# Patient Record
Sex: Female | Born: 1970 | Race: White | Hispanic: No | Marital: Single | Smoking: Never smoker
Health system: Southern US, Community
[De-identification: ages and names within clinical notes are randomized; demographics above are authoritative.]

## PROBLEM LIST (undated history)

## (undated) DIAGNOSIS — G4733 Obstructive sleep apnea (adult) (pediatric): Secondary | ICD-10-CM

## (undated) DIAGNOSIS — I10 Essential (primary) hypertension: Secondary | ICD-10-CM

## (undated) HISTORY — PX: ABDOMINAL HYSTERECTOMY: SHX81

---

## 2005-03-24 ENCOUNTER — Ambulatory Visit: Payer: Self-pay | Admitting: Internal Medicine

## 2008-09-11 ENCOUNTER — Ambulatory Visit: Payer: Self-pay | Admitting: Family Medicine

## 2010-01-09 ENCOUNTER — Ambulatory Visit: Payer: Self-pay | Admitting: Internal Medicine

## 2010-02-28 ENCOUNTER — Ambulatory Visit: Payer: Self-pay | Admitting: Internal Medicine

## 2010-03-20 ENCOUNTER — Ambulatory Visit: Payer: Self-pay | Admitting: Unknown Physician Specialty

## 2011-03-24 ENCOUNTER — Ambulatory Visit: Payer: Self-pay | Admitting: Unknown Physician Specialty

## 2011-11-03 ENCOUNTER — Ambulatory Visit: Payer: Self-pay | Admitting: Internal Medicine

## 2012-02-27 ENCOUNTER — Ambulatory Visit: Payer: Self-pay

## 2012-02-27 LAB — RAPID INFLUENZA A&B ANTIGENS

## 2012-03-28 ENCOUNTER — Ambulatory Visit: Payer: Self-pay | Admitting: Otolaryngology

## 2012-03-28 LAB — CBC WITH DIFFERENTIAL/PLATELET
Eosinophil #: 0.3 10*3/uL (ref 0.0–0.7)
Eosinophil %: 2.8 %
MCH: 29 pg (ref 26.0–34.0)
MCHC: 33.2 g/dL (ref 32.0–36.0)
MCV: 87 fL (ref 80–100)
Monocyte #: 0.7 x10 3/mm (ref 0.2–0.9)
Monocyte %: 5.7 %
Platelet: 323 10*3/uL (ref 150–440)

## 2012-03-28 LAB — BASIC METABOLIC PANEL
Anion Gap: 9 (ref 7–16)
BUN: 10 mg/dL (ref 7–18)
Creatinine: 0.83 mg/dL (ref 0.60–1.30)
EGFR (African American): >60
EGFR (Non-African Amer.): >60
Glucose: 91 mg/dL (ref 65–99)
Sodium: 138 mmol/L (ref 136–145)

## 2012-03-28 LAB — TSH: Thyroid Stimulating Horm: 3.55 u[IU]/mL

## 2012-03-29 ENCOUNTER — Ambulatory Visit: Payer: Self-pay

## 2013-04-25 ENCOUNTER — Ambulatory Visit: Payer: Self-pay

## 2013-06-13 ENCOUNTER — Ambulatory Visit: Payer: Self-pay | Admitting: Obstetrics and Gynecology

## 2013-07-14 ENCOUNTER — Ambulatory Visit: Payer: Self-pay | Admitting: Obstetrics and Gynecology

## 2014-09-07 ENCOUNTER — Ambulatory Visit: Payer: Self-pay | Admitting: Internal Medicine

## 2015-09-25 ENCOUNTER — Ambulatory Visit
Admission: EM | Admit: 2015-09-25 | Discharge: 2015-09-25 | Disposition: A | Discharge status: Home / Self Care | Payer: Managed Care, Other (non HMO) | Attending: Family Medicine | Admitting: Family Medicine

## 2015-09-25 ENCOUNTER — Ambulatory Visit
Admit: 2015-09-25 | Discharge: 2015-09-25 | Disposition: A | Discharge status: Home / Self Care | Payer: Managed Care, Other (non HMO) | Attending: Family Medicine | Admitting: Family Medicine

## 2015-09-25 DIAGNOSIS — G8929 Other chronic pain: Secondary | ICD-10-CM | POA: Diagnosis not present

## 2015-09-25 DIAGNOSIS — R102 Pelvic and perineal pain: Secondary | ICD-10-CM | POA: Diagnosis not present

## 2015-09-25 DIAGNOSIS — Z79899 Other long term (current) drug therapy: Secondary | ICD-10-CM | POA: Diagnosis not present

## 2015-09-25 DIAGNOSIS — G4733 Obstructive sleep apnea (adult) (pediatric): Secondary | ICD-10-CM | POA: Diagnosis not present

## 2015-09-25 DIAGNOSIS — R1032 Left lower quadrant pain: Secondary | ICD-10-CM | POA: Diagnosis present

## 2015-09-25 DIAGNOSIS — I10 Essential (primary) hypertension: Secondary | ICD-10-CM | POA: Insufficient documentation

## 2015-09-25 HISTORY — DX: Obstructive sleep apnea (adult) (pediatric): G47.33

## 2015-09-25 HISTORY — DX: Essential (primary) hypertension: I10

## 2015-09-25 LAB — URINALYSIS COMPLETE WITH MICROSCOPIC (ARMC ONLY)
Bilirubin Urine: NEGATIVE
Glucose, UA: NEGATIVE mg/dL
Ketones, ur: NEGATIVE mg/dL
Leukocytes, UA: NEGATIVE
NITRITE: NEGATIVE
PH: 5 (ref 5.0–8.0)
PROTEIN: NEGATIVE mg/dL
SPECIFIC GRAVITY, URINE: 1.02 (ref 1.005–1.030)

## 2015-09-25 MED ORDER — CIPROFLOXACIN HCL 500 MG PO TABS: 500.0000 mg | ORAL_TABLET | Freq: Two times a day (BID) | ORAL | Status: DC

## 2015-09-25 MED ORDER — IOHEXOL 300 MG/ML  SOLN
100.0000 mL | Freq: Once | INTRAMUSCULAR | Status: AC | PRN
  Administered 2015-09-25: 100 mL via INTRAVENOUS

## 2015-09-25 MED ORDER — METRONIDAZOLE 500 MG PO TABS: 500.0000 mg | ORAL_TABLET | Freq: Three times a day (TID) | ORAL | Status: DC

## 2015-09-25 MED ORDER — KETOROLAC TROMETHAMINE 10 MG PO TABS: 10.0000 mg | ORAL_TABLET | Freq: Three times a day (TID) | ORAL | Status: DC | PRN

## 2015-09-25 NOTE — ED Provider Notes (Signed)
CSN: [id number]     Arrival date & time 09/25/15  1225 History   First MD Initiated Contact with Patient 09/25/15 1426     Chief Complaint  Patient presents with  . Pelvic Pain   (Consider location/radiation/quality/duration/timing/severity/associated sxs/prior Treatment) HPI Comments: 44 yo female with a h/o chronic intermittent pelvic pains, presents with a recurrence of pelvic pain for the last 3 days and left lower quadrant abdominal pain. States feels like cramps. Denies any dysuria, vaginal discharge, vomiting, abnormal bowel movements, fevers, chills. Patient had a hysterectomy years ago.  The history is provided by the patient.    Past Medical History  Diagnosis Date  . Hypertension   . OSA (obstructive sleep apnea)    Past Surgical History  Procedure Laterality Date  . Abdominal hysterectomy     No family history on file. Social History  Substance Use Topics  . Smoking status: Never Smoker   . Smokeless tobacco: None  . Alcohol Use: Yes   OB History    No data available     Review of Systems  Allergies  Review of patient's allergies indicates no known allergies.  Home Medications   Prior to Admission medications   Medication Sig Start Date End Date Taking? Authorizing Provider  losartan-hydrochlorothiazide (HYZAAR) 100-25 MG tablet Take 1 tablet by mouth daily.   Yes Historical Provider, MD  ciprofloxacin (CIPRO) 500 MG tablet Take 1 tablet (500 mg total) by mouth every 12 (twelve) hours. 09/25/15   Norval Gable, MD  ketorolac (TORADOL) 10 MG tablet Take 1 tablet (10 mg total) by mouth every 8 (eight) hours as needed. 09/25/15   Norval Gable, MD  metroNIDAZOLE (FLAGYL) 500 MG tablet Take 1 tablet (500 mg total) by mouth 3 (three) times daily. 09/25/15   Norval Gable, MD   Meds Ordered and Administered this Visit  Medications - No data to display  BP 150/98 mmHg  Pulse 98  Temp(Src) 97.8 F (36.6 C) (Tympanic)  Resp 22  Ht 5\' 3"  (1.6 m)  Wt 370 lb  (167.831 kg)  BMI 65.56 kg/m2  SpO2 98% No data found.   Physical Exam  Constitutional: She appears well-developed and well-nourished. No distress.  Abdominal: Soft. Bowel sounds are normal. She exhibits no distension and no mass. There is tenderness (left lower quadrant tenderness to palpation). There is no rebound and no guarding.  Genitourinary: Vagina normal. Pelvic exam was performed with patient supine. Cervix exhibits no motion tenderness and no discharge. Right adnexum displays no mass. Left adnexum displays no mass.  Skin: She is not diaphoretic.  Nursing note and vitals reviewed.   ED Course  Procedures (including critical care time)  Labs Review Labs Reviewed  URINALYSIS COMPLETEWITH MICROSCOPIC ([place]) - Abnormal; Notable for the following:    Hgb urine dipstick 2+ (*)    Bacteria, UA MANY (*)    Squamous Epithelial / LPF 6-30 (*)    All other components within normal limits  URINE CULTURE    Imaging Review Ct Abdomen Pelvis W Contrast  09/25/2015  CLINICAL DATA:  Acute worsening of chronic pelvic pain. Acute left lower quadrant abdominal pain. EXAM: CT ABDOMEN AND PELVIS WITH CONTRAST TECHNIQUE: Multidetector CT imaging of the abdomen and pelvis was performed using the standard protocol following bolus administration of intravenous contrast. CONTRAST:  117mL OMNIPAQUE IOHEXOL 300 MG/ML  SOLN COMPARISON:  None. FINDINGS: Lung bases are clear. No effusions. Heart is normal size. Small hiatal hernia. Diffuse fatty infiltration of the liver. No focal  abnormality. Gallbladder, spleen, pancreas, right adrenal and kidneys are unremarkable. 2.5 cm nodule in the left adrenal gland. This most likely reflects an adenoma, measuring 11 Hounsfield units on delayed imaging. There is sigmoid diverticulosis with surrounding inflammatory changes compatible with active diverticulitis. Stomach and small bowel are unremarkable. No free fluid, free air or adenopathy. Prior hysterectomy. No  adnexal masses. Urinary bladder is unremarkable. No acute bony abnormality or focal bone lesion. IMPRESSION: Sigmoid diverticulosis with active diverticulitis. Fatty liver. Left adrenal adenoma. Small hiatal hernia. Electronically Signed   By: Rolm Baptise M.D.   On: 09/25/2015 17:40     Visual Acuity Review  Right Eye Distance:   Left Eye Distance:   Bilateral Distance:    Right Eye Near:   Left Eye Near:    Bilateral Near:         MDM   1. Pelvic pain in female   2. Abdominal pain, left lower quadrant    Discharge Medication List as of 09/25/2015  3:33 PM    START taking these medications   Details  ketorolac (TORADOL) 10 MG tablet Take 1 tablet (10 mg total) by mouth every 8 (eight) hours as needed., Starting 09/25/2015, Until Discontinued, Normal      1. Labs/x-ray results and diagnosis reviewed with patient; patient referred for CT abdomen/pelvis at [facility] and results discussed with patient (positive for diverticulitis) 2. rx as per orders above; reviewed possible side effects, interactions, risks and benefits  3. Recommend supportive treatment with clear liquids, then advance slowly as tolerated 4. Follow up with PCP in next 7-10 days 5. F/u prn if symptoms worsen or don't improve   Norval Gable, MD 09/25/15 2031

## 2015-09-25 NOTE — ED Notes (Signed)
Pt states "before I had my hysterectomy I would pass large blood clots with my period and I would cramp, that what I feel like now, just cramp. I am not having urinary pain, or irritation. I am having constant pelvic pain."

## 2015-09-27 LAB — URINE CULTURE: Special Requests: NORMAL

## 2015-09-30 NOTE — ED Notes (Signed)
Final report of urine C&S shows multiple species = contaminated specimen

## 2015-10-28 DIAGNOSIS — Z9989 Dependence on other enabling machines and devices: Secondary | ICD-10-CM

## 2015-10-28 DIAGNOSIS — G4733 Obstructive sleep apnea (adult) (pediatric): Secondary | ICD-10-CM | POA: Insufficient documentation

## 2016-10-28 DIAGNOSIS — R7989 Other specified abnormal findings of blood chemistry: Secondary | ICD-10-CM | POA: Insufficient documentation

## 2016-10-30 ENCOUNTER — Other Ambulatory Visit: Payer: Self-pay | Admitting: Internal Medicine

## 2016-10-30 DIAGNOSIS — Z1231 Encounter for screening mammogram for malignant neoplasm of breast: Secondary | ICD-10-CM

## 2016-11-04 ENCOUNTER — Ambulatory Visit
Admission: RE | Admit: 2016-11-04 | Discharge: 2016-11-04 | Disposition: A | Discharge status: Home / Self Care | Payer: Managed Care, Other (non HMO) | Source: Ambulatory Visit | Attending: Internal Medicine | Admitting: Internal Medicine

## 2016-11-04 DIAGNOSIS — Z1231 Encounter for screening mammogram for malignant neoplasm of breast: Secondary | ICD-10-CM | POA: Insufficient documentation

## 2017-10-19 ENCOUNTER — Other Ambulatory Visit: Payer: Self-pay | Admitting: Internal Medicine

## 2017-11-15 ENCOUNTER — Other Ambulatory Visit: Payer: Self-pay | Admitting: Internal Medicine

## 2017-11-15 DIAGNOSIS — Z1231 Encounter for screening mammogram for malignant neoplasm of breast: Secondary | ICD-10-CM

## 2017-11-30 ENCOUNTER — Ambulatory Visit
Admission: RE | Admit: 2017-11-30 | Discharge: 2017-11-30 | Disposition: A | Discharge status: Home / Self Care | Payer: Managed Care, Other (non HMO) | Source: Ambulatory Visit | Attending: Internal Medicine | Admitting: Internal Medicine

## 2017-11-30 ENCOUNTER — Encounter (INDEPENDENT_AMBULATORY_CARE_PROVIDER_SITE_OTHER): Payer: Self-pay

## 2017-11-30 DIAGNOSIS — Z1231 Encounter for screening mammogram for malignant neoplasm of breast: Secondary | ICD-10-CM

## 2018-11-01 ENCOUNTER — Other Ambulatory Visit: Payer: Self-pay | Admitting: Internal Medicine

## 2018-11-01 DIAGNOSIS — R319 Hematuria, unspecified: Secondary | ICD-10-CM

## 2018-11-02 ENCOUNTER — Other Ambulatory Visit: Payer: Self-pay | Admitting: Internal Medicine

## 2018-11-02 DIAGNOSIS — Z1231 Encounter for screening mammogram for malignant neoplasm of breast: Secondary | ICD-10-CM

## 2018-11-21 ENCOUNTER — Ambulatory Visit: Payer: Managed Care, Other (non HMO)

## 2018-11-24 ENCOUNTER — Other Ambulatory Visit: Payer: Self-pay

## 2018-11-24 DIAGNOSIS — R3129 Other microscopic hematuria: Secondary | ICD-10-CM

## 2018-11-25 ENCOUNTER — Encounter: Payer: Self-pay | Admitting: Urology

## 2018-11-25 ENCOUNTER — Other Ambulatory Visit
Admission: RE | Admit: 2018-11-25 | Discharge: 2018-11-25 | Disposition: A | Discharge status: Home / Self Care | Payer: Managed Care, Other (non HMO) | Attending: Urology | Admitting: Urology

## 2018-11-25 ENCOUNTER — Ambulatory Visit (INDEPENDENT_AMBULATORY_CARE_PROVIDER_SITE_OTHER): Payer: Managed Care, Other (non HMO) | Admitting: Urology

## 2018-11-25 ENCOUNTER — Other Ambulatory Visit: Payer: Self-pay

## 2018-11-25 VITALS — BP 142/69 | HR 71 | Wt 378.0 lb

## 2018-11-25 DIAGNOSIS — R3129 Other microscopic hematuria: Secondary | ICD-10-CM

## 2018-11-25 LAB — URINALYSIS, COMPLETE (UACMP) WITH MICROSCOPIC
BACTERIA UA: NONE SEEN
Bilirubin Urine: NEGATIVE
GLUCOSE, UA: NEGATIVE mg/dL
KETONES UR: NEGATIVE mg/dL
LEUKOCYTES UA: NEGATIVE
Nitrite: NEGATIVE
Protein, ur: NEGATIVE mg/dL
Specific Gravity, Urine: 1.015 (ref 1.005–1.030)
WBC, UA: NONE SEEN WBC/hpf (ref 0–5)
pH: 5.5 (ref 5.0–8.0)

## 2018-11-25 NOTE — Progress Notes (Signed)
11/25/2018 12:06 PM   Angel Sandoval 03-01-1971 [id number]  Referring provider: Rusty Aus, MD [facility] [place], [place] [address]  Chief Complaint  Patient presents with  . Establish Care  . Hematuria    HPI: 47 year old female referred for further evaluation of microscopic hematuria.  She underwent routine urinalysis by her primary care physician on 10/24/2018 which showed 4-10 red blood cells per high-powered field, otherwise UA was unremarkable.  This was repeated 1 week later which showed 10-50 red blood cells in the absence of infection.  She did have incidental 10-25,000 colonies of group B strep, contaminant.  She has a remote history of kidney stones, passed one stone sponteously years ago.  She never required surgical intervention.  Never smoker and no environmental exposures.    She is never seen gross hematuria.   PMH: Past Medical History:  Diagnosis Date  . Hypertension   . OSA (obstructive sleep apnea)     Surgical History: Past Surgical History:  Procedure Laterality Date  . ABDOMINAL HYSTERECTOMY      Home Medications:  Allergies as of 11/25/2018      Reactions   Corticosteroids Other (See Comments)   Weight gain. Patient would like to avoid.      Medication List       Accurate as of November 25, 2018 12:06 PM. Always use your most recent med list.        hydrochlorothiazide 12.5 MG tablet Commonly known as:  HYDRODIURIL Take 12.5 mg by mouth daily.   olmesartan 20 MG tablet Commonly known as:  BENICAR Take 20 mg by mouth daily.       Allergies:  Allergies  Allergen Reactions  . Corticosteroids Other (See Comments)    Weight gain. Patient would like to avoid.    Family History: Family History  Problem Relation Age of Onset  . Breast cancer Mother 98  . Hypertension Father     Social History:  reports that she has never smoked. She has never used smokeless tobacco.  She reports current alcohol use. She reports that she does not use drugs.  ROS: UROLOGY Frequent Urination?: No Hard to postpone urination?: Yes Burning/pain with urination?: No Get up at night to urinate?: No Leakage of urine?: No Urine stream starts and stops?: No Trouble starting stream?: No Do you have to strain to urinate?: No Blood in urine?: Yes Urinary tract infection?: No Sexually transmitted disease?: No Injury to kidneys or bladder?: No Painful intercourse?: No Weak stream?: No Currently pregnant?: No Vaginal bleeding?: No Last menstrual period?: n  Gastrointestinal Nausea?: No Vomiting?: No Indigestion/heartburn?: No Diarrhea?: Yes Constipation?: Yes  Constitutional Fever: No Night sweats?: No Weight loss?: No Fatigue?: No  Skin Skin rash/lesions?: No Itching?: No  Eyes Blurred vision?: No Double vision?: No  Ears/Nose/Throat Sore throat?: No Sinus problems?: No  Hematologic/Lymphatic Swollen glands?: No Easy bruising?: No  Cardiovascular Leg swelling?: No Chest pain?: No  Respiratory Cough?: No Shortness of breath?: No  Endocrine Excessive thirst?: No  Musculoskeletal Back pain?: No Joint pain?: No  Neurological Headaches?: No Dizziness?: No  Psychologic Depression?: No Anxiety?: No  Physical Exam: BP (!) 142/69   Pulse 71   Wt (!) 378 lb (171.5 kg)   BMI 66.96 kg/m   Constitutional:  Alert and oriented, No acute distress. HEENT: [place] AT, moist mucus membranes.  Trachea midline, no masses. Cardiovascular: No clubbing, cyanosis, or edema. Respiratory: Normal respiratory effort, no increased work of  breathing. GI: Morbidly obese Skin: No rashes, bruises or suspicious lesions. Neurologic: Grossly intact, no focal deficits, moving all 4 extremities. Psychiatric: Normal mood and affect.  Laboratory Data: Cr 0.7 10/24/2018  Urinalysis Results for orders placed or performed during the hospital encounter of 11/25/18    Urinalysis, Complete w Microscopic  Result Value Ref Range   Color, Urine YELLOW YELLOW   APPearance CLEAR CLEAR   Specific Gravity, Urine 1.015 1.005 - 1.030   pH 5.5 5.0 - 8.0   Glucose, UA NEGATIVE NEGATIVE mg/dL   Hgb urine dipstick MODERATE (A) NEGATIVE   Bilirubin Urine NEGATIVE NEGATIVE   Ketones, ur NEGATIVE NEGATIVE mg/dL   Protein, ur NEGATIVE NEGATIVE mg/dL   Nitrite NEGATIVE NEGATIVE   Leukocytes, UA NEGATIVE NEGATIVE   Squamous Epithelial / LPF 0-5 0 - 5   WBC, UA NONE SEEN 0 - 5 WBC/hpf   RBC / HPF 0-5 0 - 5 RBC/hpf   Bacteria, UA NONE SEEN NONE SEEN    Pertinent Imaging: NA  Assessment & Plan:    1. Microscopic hematuria We discussed the differential diagnosis for microscopic hematuria including nephrolithiasis, renal or upper tract tumors, bladder stones, UTIs, or bladder tumors as well as undetermined etiologies. Per AUA guidelines, I did recommend complete microscopic hematuria evaluation including CTU, possible urine cytology, and office cystoscopy.  - CT HEMATURIA WORKUP; Future    Return in about 4 weeks (around 12/23/2018) for cysto/ CT urogram.  Hollice Espy, MD  [facility] [address] [place], [place] [address] [phone]

## 2018-11-25 NOTE — Patient Instructions (Signed)
Cystoscopy  Cystoscopy is a procedure that is used to help diagnose and sometimes treat conditions that affect that lower urinary tract. The lower urinary tract includes the bladder and the tube that drains urine from the bladder out of the body (urethra). Cystoscopy is performed with a thin, tube-shaped instrument with a light and camera at the end (cystoscope). The cystoscope may be hard (rigid) or flexible, depending on the goal of the procedure.The cystoscope is inserted through the urethra, into the bladder.  Cystoscopy may be recommended if you have:   Urinary tractinfections that keep coming back (recurring).   Blood in the urine (hematuria).   Loss of bladder control (urinary incontinence) or an overactive bladder.   Unusual cells found in a urine sample.   A blockage in the urethra.   Painful urination.   An abnormality in the bladder found during an intravenous pyelogram (IVP) or CT scan.    Cystoscopy may also be done to remove a sample of tissue to be examined under a microscope (biopsy).  Tell a health care provider about:   Any allergies you have.   All medicines you are taking, including vitamins, herbs, eye drops, creams, and over-the-counter medicines.   Any problems you or family members have had with anesthetic medicines.   Any blood disorders you have.   Any surgeries you have had.   Any medical conditions you have.   Whether you are pregnant or may be pregnant.  What are the risks?  Generally, this is a safe procedure. However, problems may occur, including:   Infection.   Bleeding.   Allergic reactions to medicines.   Damage to other structures or organs.    What happens before the procedure?   Ask your health care provider about:  ? Changing or stopping your regular medicines. This is especially important if you are taking diabetes medicines or blood thinners.  ? Taking medicines such as aspirin and ibuprofen. These medicines can thin your blood. Do not take these medicines  before your procedure if your health care provider instructs you not to.   Follow instructions from your health care provider about eating or drinking restrictions.   You may be given antibiotic medicine to help prevent infection.   You may have an exam or testing, such as X-rays of the bladder, urethra, or kidneys.   You may have urine tests to check for signs of infection.   Plan to have someone take you home after the procedure.  What happens during the procedure?   To reduce your risk of infection,your health care team will wash or sanitize their hands.   You will be given one or more of the following:  ? A medicine to help you relax (sedative).  ? A medicine to numb the area (local anesthetic).   The area around the opening of your urethra will be cleaned.   The cystoscope will be passed through your urethra into your bladder.   Germ-free (sterile)fluid will flow through the cystoscope to fill your bladder. The fluid will stretch your bladder so that your surgeon can clearly examine your bladder walls.   The cystoscope will be removed and your bladder will be emptied.  The procedure may vary among health care providers and hospitals.  What happens after the procedure?   You may have some soreness or pain in your abdomen and urethra. Medicines will be available to help you.   You may have some blood in your urine.   Do not   drive for 24 hours if you received a sedative.  This information is not intended to replace advice given to you by your health care provider. Make sure you discuss any questions you have with your health care provider.  Document Released: 11/27/2000 Document Revised: 04/09/2016 Document Reviewed: 10/17/2015  Elsevier Interactive Patient Education  2018 [employer]

## 2018-12-01 ENCOUNTER — Ambulatory Visit
Admission: RE | Admit: 2018-12-01 | Discharge: 2018-12-01 | Disposition: A | Discharge status: Home / Self Care | Payer: Managed Care, Other (non HMO) | Source: Ambulatory Visit | Attending: Internal Medicine | Admitting: Internal Medicine

## 2018-12-01 DIAGNOSIS — Z1231 Encounter for screening mammogram for malignant neoplasm of breast: Secondary | ICD-10-CM | POA: Diagnosis not present

## 2019-01-10 ENCOUNTER — Ambulatory Visit
Admission: RE | Admit: 2019-01-10 | Discharge: 2019-01-10 | Disposition: A | Discharge status: Home / Self Care | Payer: Managed Care, Other (non HMO) | Source: Ambulatory Visit | Attending: Urology | Admitting: Urology

## 2019-01-10 ENCOUNTER — Other Ambulatory Visit: Payer: Self-pay

## 2019-01-10 ENCOUNTER — Other Ambulatory Visit
Admission: RE | Admit: 2019-01-10 | Discharge: 2019-01-10 | Disposition: A | Discharge status: Home / Self Care | Payer: Managed Care, Other (non HMO) | Source: Home / Self Care | Attending: Urology | Admitting: Urology

## 2019-01-10 DIAGNOSIS — R3129 Other microscopic hematuria: Secondary | ICD-10-CM | POA: Insufficient documentation

## 2019-01-10 LAB — CREATININE, SERUM: Creatinine, Ser: 0.74 mg/dL (ref 0.44–1.00)

## 2019-01-10 MED ORDER — IOHEXOL 300 MG/ML  SOLN
150.0000 mL | Freq: Once | INTRAMUSCULAR | Status: AC | PRN
  Administered 2019-01-10: 125 mL via INTRAVENOUS

## 2019-01-12 ENCOUNTER — Other Ambulatory Visit: Payer: Self-pay

## 2019-01-12 DIAGNOSIS — R3129 Other microscopic hematuria: Secondary | ICD-10-CM

## 2019-01-13 ENCOUNTER — Encounter: Payer: Self-pay | Admitting: Urology

## 2019-01-13 ENCOUNTER — Ambulatory Visit (INDEPENDENT_AMBULATORY_CARE_PROVIDER_SITE_OTHER): Payer: Managed Care, Other (non HMO) | Admitting: Urology

## 2019-01-13 ENCOUNTER — Other Ambulatory Visit
Admission: RE | Admit: 2019-01-13 | Discharge: 2019-01-13 | Disposition: A | Discharge status: Home / Self Care | Payer: Managed Care, Other (non HMO) | Attending: Urology | Admitting: Urology

## 2019-01-13 VITALS — BP 138/70 | HR 84 | Ht 64.0 in | Wt 370.0 lb

## 2019-01-13 DIAGNOSIS — R3129 Other microscopic hematuria: Secondary | ICD-10-CM

## 2019-01-13 DIAGNOSIS — D3502 Benign neoplasm of left adrenal gland: Secondary | ICD-10-CM

## 2019-01-13 DIAGNOSIS — K439 Ventral hernia without obstruction or gangrene: Secondary | ICD-10-CM

## 2019-01-13 LAB — URINALYSIS, COMPLETE (UACMP) WITH MICROSCOPIC
Bilirubin Urine: NEGATIVE
Glucose, UA: NEGATIVE mg/dL
Ketones, ur: NEGATIVE mg/dL
Leukocytes, UA: NEGATIVE
Nitrite: NEGATIVE
PH: 6.5 (ref 5.0–8.0)
Protein, ur: NEGATIVE mg/dL
SPECIFIC GRAVITY, URINE: 1.02 (ref 1.005–1.030)
WBC, UA: NONE SEEN WBC/hpf (ref 0–5)

## 2019-01-13 NOTE — Progress Notes (Signed)
   01/13/19  CC:  Chief Complaint  Patient presents with  . Cysto    HPI: 48 year old female with microscopic hematuria returns the office today for cystoscopy.  In the interim, she underwent CT urogram on 01/10/2019 which showed some non-urologic incidental findings including suprapubic umbilical hernia, incidental left adrenal adenoma (3.6 by 3.4 cm left adrenal adenoma), colonic diverticula, type I hiatal hernia, degenerative disc disease and osteitis pubis.  Blood pressure 138/70, pulse 84, height 5\' 4"  (1.626 m), weight (!) 370 lb (167.8 kg). NED. A&Ox3.   No respiratory distress   Abd soft, NT, ND Normal external genitalia with patent urethral meatus  Cystoscopy Procedure Note  Patient identification was confirmed, informed consent was obtained, and patient was prepped using Betadine solution.  Lidocaine jelly was administered per urethral meatus.    Procedure: - Flexible cystoscope introduced, without any difficulty.   - Thorough search of the bladder revealed:    normal urethral meatus    normal urothelium    no stones    no ulcers     no tumors    no urethral polyps    no trabeculation  - Ureteral orifices were normal in position and appearance.  Small amount of squamous metaplasia appreciable extending to the bladder neck towards the trigone without any other significant findings.  Post-Procedure: - Patient tolerated the procedure well  Assessment/ Plan:  1. Microscopic hematuria Status post negative work-up including CT urogram and cystoscopy today Patient was reassured  2. Adrenal adenoma, left Incidental left adrenal adenoma, essentially stable from CT CAT scan in 2016. We discussed the differential chain functional and nonfunctional adenoma She has no signs or symptoms of a functional adenoma including no history of palpitation, flushing, anxiety, low potassium etc.  She does have hypertension which is managed on 1 antihypertensive medication in the  setting of morbid obesity. She was offered referral to endocrinology for further metabolic evaluation but declined  3. Ventral hernia without obstruction or gangrene Incidental ventral hernia, relatively asymptomatic.  We discussed the option of referral to general surgery if she becomes symptomatic although would strongly recommend weight loss prior to any surgical regimen.  She is agreeable this plan.  F/u prn  Hollice Espy, MD

## 2019-11-17 IMAGING — CT CT ABD-PEL WO/W CM
3 of 6 series · 14 of 32 positions shown, 19 images · IV contrast (omnipaque)
Comparison: 09/25/2015

CLINICAL DATA: Gross hematuria since October 2018. History of
renal calculi. Altered bowel habits..

EXAM:
CT ABDOMEN AND PELVIS WITHOUT AND WITH CONTRAST
TECHNIQUE: Multidetector CT imaging of the abdomen and pelvis was performed
following the standard protocol before and following the bolus
administration of intravenous contrast.
CONTRAST:  125mL OMNIPAQUE IOHEXOL 300 MG/ML  SOLN

[Series 2: axial pre · axial · non-contrast · 0.85mm/px · z∈[-852,-512]mm · 6 of 96 slices shown, 11 images]
[im 14/96  soft-tissue]
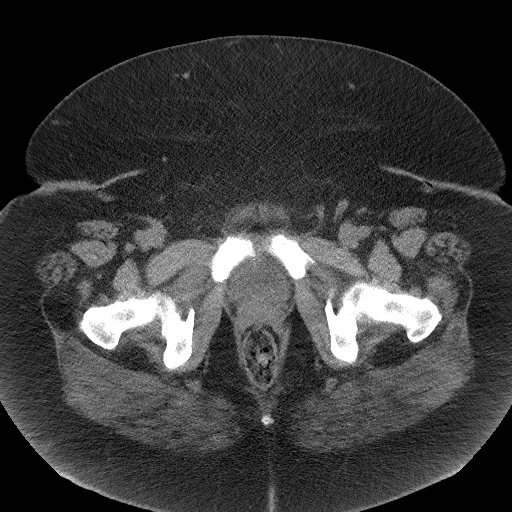
[im 14/96  bone]
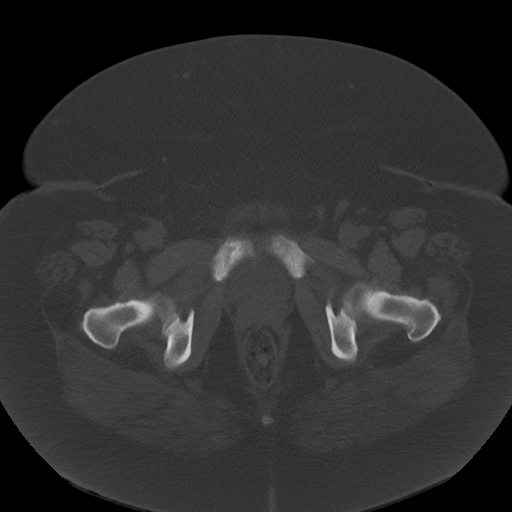
[im 28/96  soft-tissue]
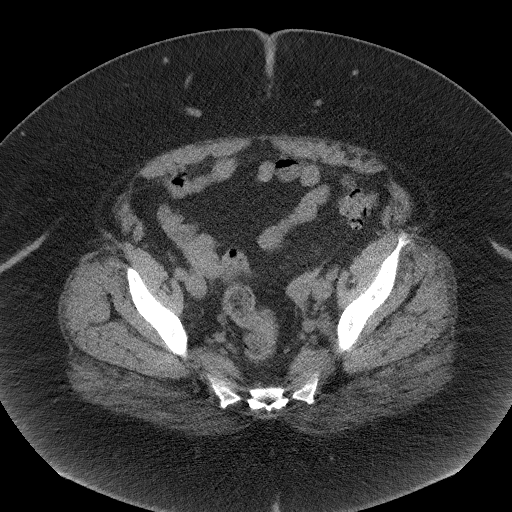
[im 41/96  soft-tissue]
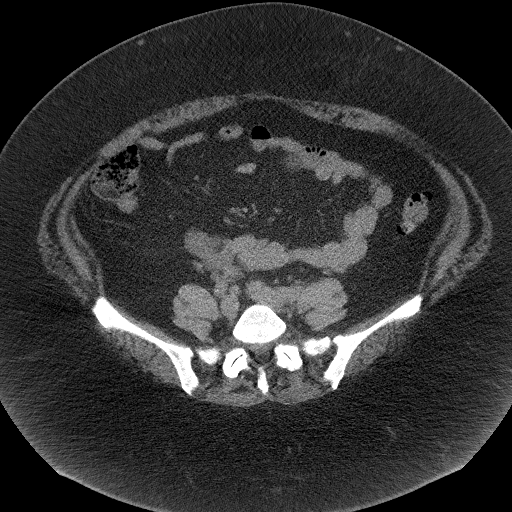
[im 41/96  lung]
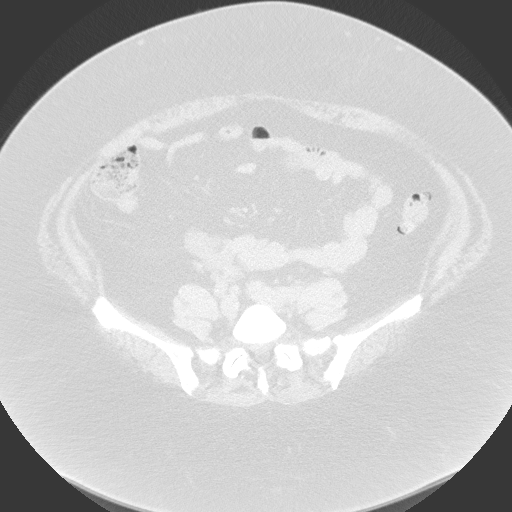
[im 55/96  soft-tissue]
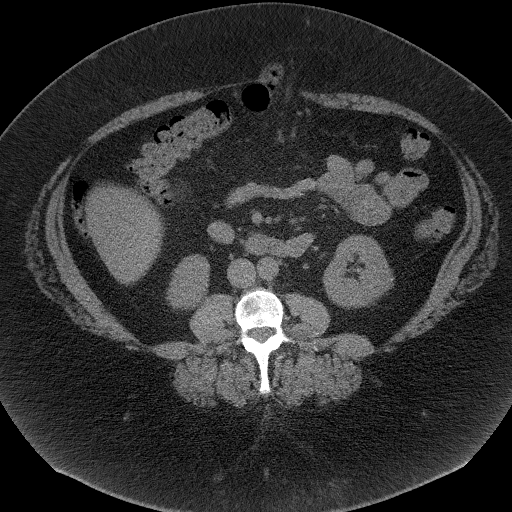
[im 55/96  lung]
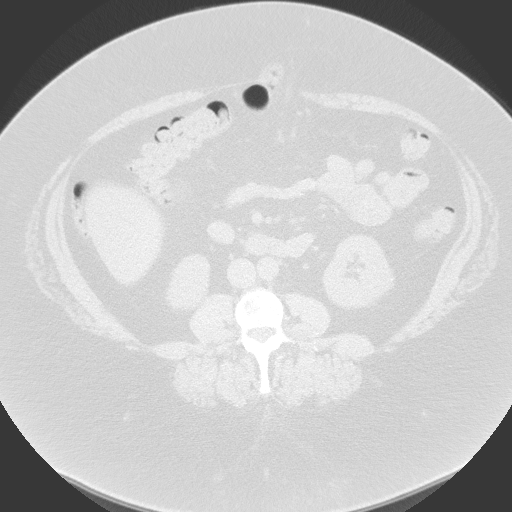
[im 68/96  soft-tissue]
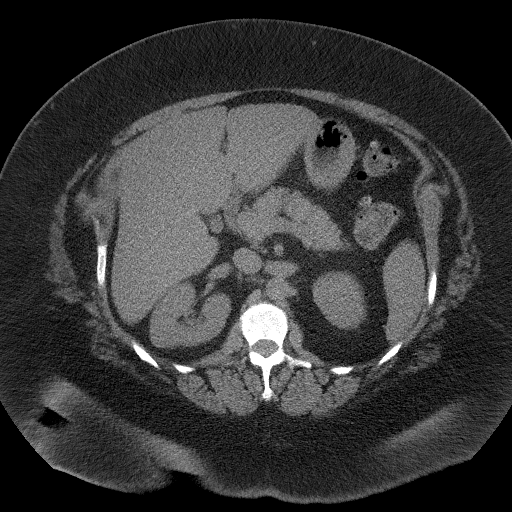
[im 68/96  lung]
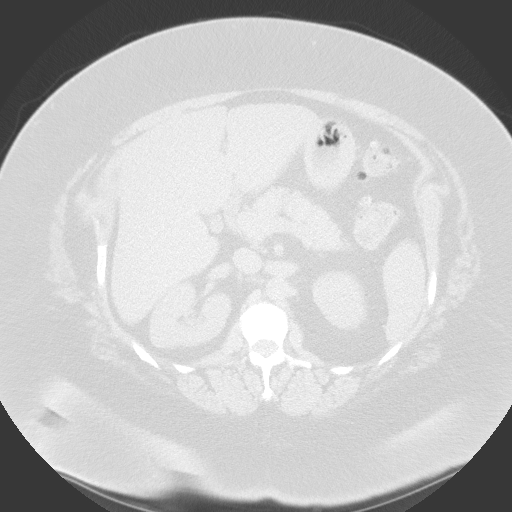
[im 82/96  soft-tissue]
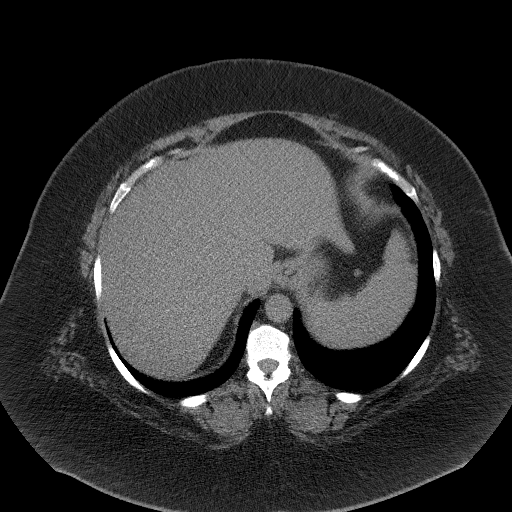
[im 82/96  lung]
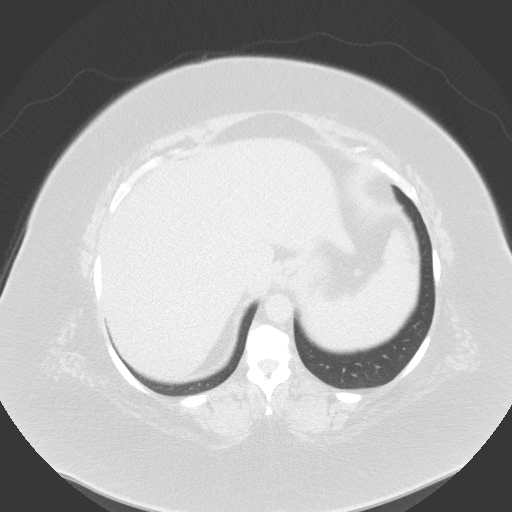

[Series 4: axial post · axial · 0.86mm/px · z∈[-851,-511]mm · 6 of 96 slices shown]
[im 14/96  soft-tissue]
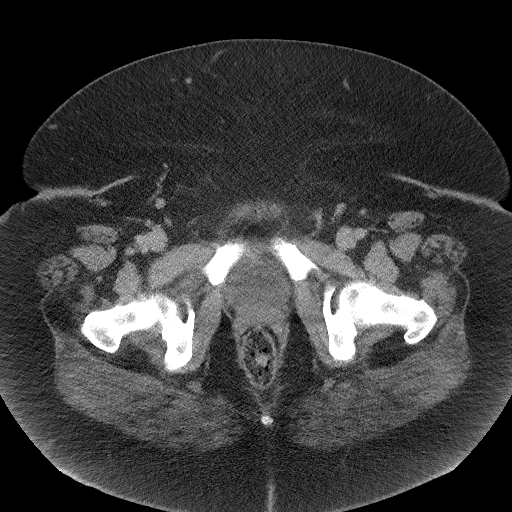
[im 28/96  soft-tissue]
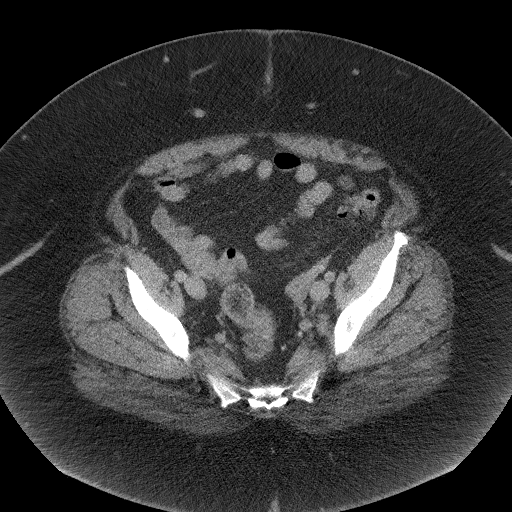
[im 41/96  soft-tissue]
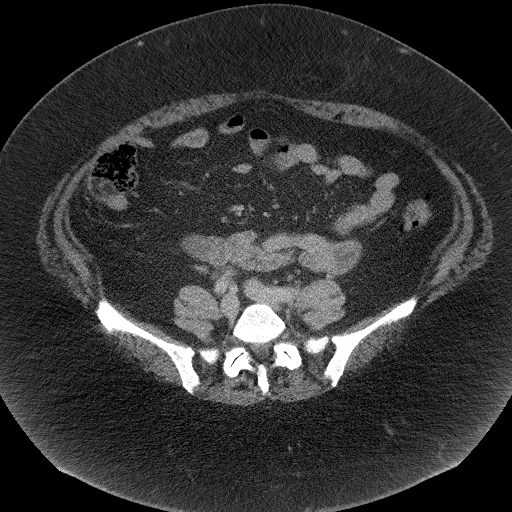
[im 55/96  soft-tissue]
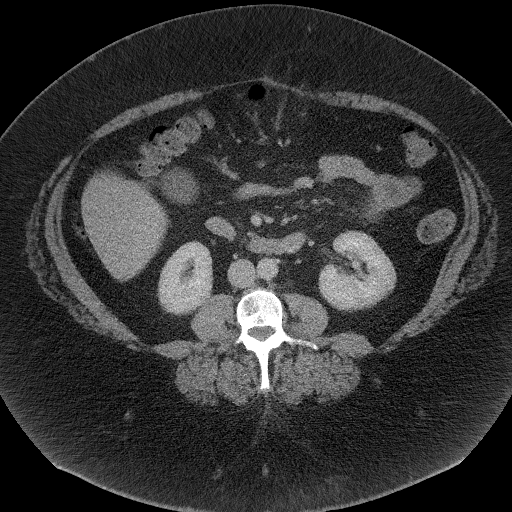
[im 68/96  soft-tissue]
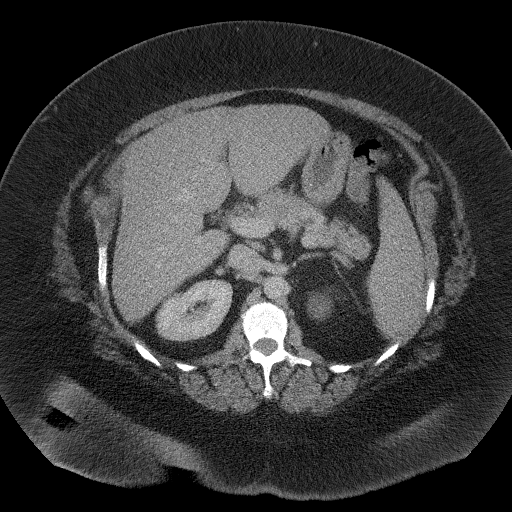
[im 82/96  soft-tissue]
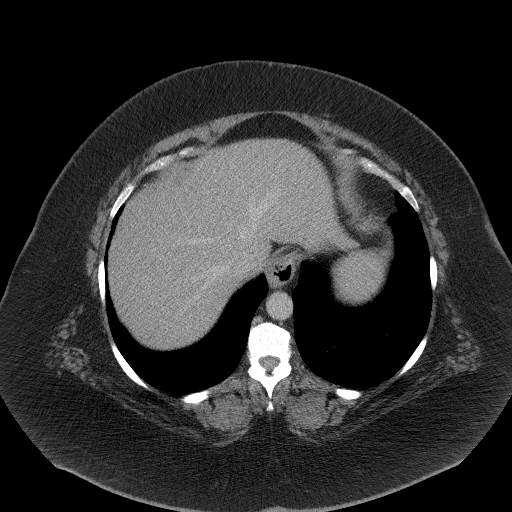

[Series 7: axial delay · axial · delayed · 0.86mm/px · z∈[-841,-776]mm · 2 of 94 slices shown]
[im 14/94  soft-tissue]
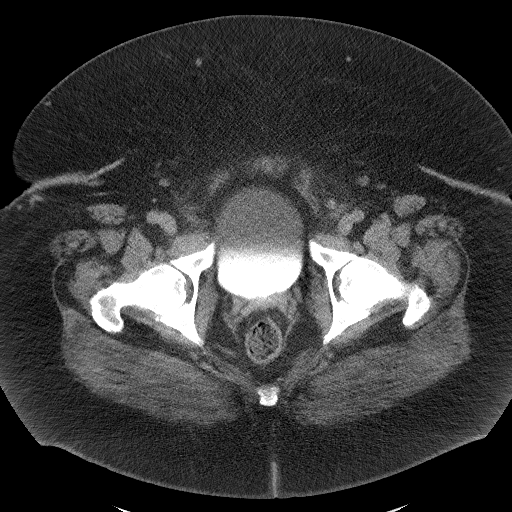
[im 27/94  soft-tissue]
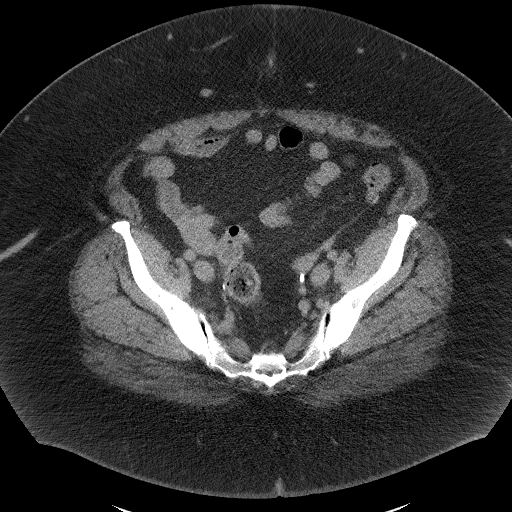

[14 of 32 positions shown; findings below may reference images not displayed]

FINDINGS: Lower chest: Small type 1 hiatal hernia.

Hepatobiliary: 4 mm hypodense lesion in the right hepatic lobe on
image [DATE], stable and felt to be benign. Gallbladder unremarkable.
No biliary dilatation.

Pancreas: Unremarkable

Spleen: Unremarkable

Adrenals/Urinary Tract: 3.6 by 3.4 cm left adrenal adenoma.

No urinary tract calculi, abnormal renal parenchymal enhancement, or
abnormal urographic phase filling defect along the urothelium to
explain the patient's hematuria.

Stomach/Bowel: Left paracentral ventral hernia contains adipose
tissue and a small portion of the transverse colon. No findings of
strangulation or obstruction. Sigmoid diverticulosis with scattered
diverticula elsewhere in the colon.

Vascular/Lymphatic: Unremarkable

Reproductive: Uterus absent.  Adnexa unremarkable.

Other: No supplemental non-categorized findings.

Musculoskeletal: Osteitis pubis. Bilateral chronic sacroiliitis with
subcortical sclerosis. Grade 1 degenerative anterolisthesis at L4-5.
IMPRESSION: 1. No cause for hematuria is identified.
2. Supraumbilical ventral hernia containing part of the transverse
colon and adipose tissue without complicating feature.
3. Left adrenal adenoma.
4. Scattered colonic diverticula most concentrated in the sigmoid
colon.
5. Small type 1 hiatal hernia.
6. Grade 1 degenerative anterolisthesis at L4-5.
7. Osteitis pubis with bilateral chronic sacroiliitis.

## 2022-04-02 ENCOUNTER — Encounter: Payer: Self-pay | Admitting: *Deleted

## 2022-04-03 ENCOUNTER — Encounter
Admission: RE | Disposition: A | Discharge status: Home / Self Care | Payer: Self-pay | Source: Home / Self Care | Attending: Gastroenterology

## 2022-04-03 ENCOUNTER — Ambulatory Visit: Payer: BC Managed Care – PPO | Admitting: Certified Registered"

## 2022-04-03 ENCOUNTER — Ambulatory Visit
Admission: RE | Admit: 2022-04-03 | Discharge: 2022-04-03 | Disposition: A | Discharge status: Home / Self Care | Payer: BC Managed Care – PPO | Attending: Gastroenterology | Admitting: Gastroenterology

## 2022-04-03 ENCOUNTER — Encounter: Payer: Self-pay | Admitting: *Deleted

## 2022-04-03 DIAGNOSIS — G4733 Obstructive sleep apnea (adult) (pediatric): Secondary | ICD-10-CM | POA: Diagnosis not present

## 2022-04-03 DIAGNOSIS — K640 First degree hemorrhoids: Secondary | ICD-10-CM | POA: Insufficient documentation

## 2022-04-03 DIAGNOSIS — Z90710 Acquired absence of both cervix and uterus: Secondary | ICD-10-CM | POA: Diagnosis not present

## 2022-04-03 DIAGNOSIS — K5140 Inflammatory polyps of colon without complications: Secondary | ICD-10-CM | POA: Diagnosis not present

## 2022-04-03 DIAGNOSIS — I10 Essential (primary) hypertension: Secondary | ICD-10-CM | POA: Insufficient documentation

## 2022-04-03 DIAGNOSIS — Z1211 Encounter for screening for malignant neoplasm of colon: Secondary | ICD-10-CM | POA: Diagnosis present

## 2022-04-03 DIAGNOSIS — K5730 Diverticulosis of large intestine without perforation or abscess without bleeding: Secondary | ICD-10-CM | POA: Diagnosis not present

## 2022-04-03 DIAGNOSIS — E119 Type 2 diabetes mellitus without complications: Secondary | ICD-10-CM | POA: Diagnosis not present

## 2022-04-03 HISTORY — PX: COLONOSCOPY WITH PROPOFOL: SHX5780

## 2022-04-03 SURGERY — COLONOSCOPY WITH PROPOFOL
Anesthesia: General

## 2022-04-03 MED ORDER — DEXMEDETOMIDINE HCL IN NACL 200 MCG/50ML IV SOLN
INTRAVENOUS | Status: DC | PRN
  Administered 2022-04-03: 12 ug via INTRAVENOUS

## 2022-04-03 MED ORDER — KETAMINE HCL 10 MG/ML IJ SOLN
INTRAMUSCULAR | Status: DC | PRN
  Administered 2022-04-03: 25 mg via INTRAVENOUS

## 2022-04-03 MED ORDER — SODIUM CHLORIDE 0.9 % IV SOLN
INTRAVENOUS | Status: DC
  Administered 2022-04-03: 20 mL/h via INTRAVENOUS

## 2022-04-03 MED ORDER — KETAMINE HCL 50 MG/5ML IJ SOSY
PREFILLED_SYRINGE | INTRAMUSCULAR | Status: AC
  Filled 2022-04-03: qty 5

## 2022-04-03 MED ORDER — PROPOFOL 10 MG/ML IV BOLUS
INTRAVENOUS | Status: DC | PRN
  Administered 2022-04-03: 60 mg via INTRAVENOUS
  Administered 2022-04-03: 50 mg via INTRAVENOUS

## 2022-04-03 MED ORDER — PROPOFOL 500 MG/50ML IV EMUL
INTRAVENOUS | Status: AC
  Filled 2022-04-03: qty 50

## 2022-04-03 MED ORDER — LIDOCAINE HCL (CARDIAC) PF 100 MG/5ML IV SOSY
PREFILLED_SYRINGE | INTRAVENOUS | Status: DC | PRN
Start: 2022-04-03 — End: 2022-04-03
  Administered 2022-04-03: 100 mg via INTRAVENOUS

## 2022-04-03 MED ORDER — PROPOFOL 500 MG/50ML IV EMUL
INTRAVENOUS | Status: DC | PRN
Start: 2022-04-03 — End: 2022-04-03
  Administered 2022-04-03: 100 ug/kg/min via INTRAVENOUS

## 2022-04-03 NOTE — Transfer of Care (Signed)
Immediate Anesthesia Transfer of Care Note ? ?Patient: Angel Sandoval ? ?Procedure(s) Performed: COLONOSCOPY WITH PROPOFOL ? ?Patient Location: PACU ? ?Anesthesia Type:General ? ?Level of Consciousness: awake ? ?Airway & Oxygen Therapy: Patient Spontanous Breathing ? ?Post-op Assessment: Report given to RN ? ?Post vital signs: stable ? ?Last Vitals:  ?Vitals Value Taken Time  ?BP    ?Temp    ?Pulse    ?Resp    ?SpO2    ? ? ?Last Pain:  ?Vitals:  ? 04/03/22 0654  ?TempSrc: Temporal  ?PainSc: 0-No pain  ?   ? ?  ? ?Complications: No notable events documented. ?

## 2022-04-03 NOTE — Interval H&P Note (Signed)
History and Physical Interval Note: ? ?04/03/2022 ?7:45 AM ? ?Angel Sandoval  has presented today for surgery, with the diagnosis of Screening Colonoscopy.  The various methods of treatment have been discussed with the patient and family. After consideration of risks, benefits and other options for treatment, the patient has consented to  Procedure(s): ?COLONOSCOPY WITH PROPOFOL (N/A) as a surgical intervention.  The patient's history has been reviewed, patient examined, no change in status, stable for surgery.  I have reviewed the patient's chart and labs.  Questions were answered to the patient's satisfaction.   ? ? ?Angel Sandoval ? ?Ok to proceed with colonoscopy ?

## 2022-04-03 NOTE — Anesthesia Postprocedure Evaluation (Signed)
Anesthesia Post Note ? ?Patient: Angel Sandoval ? ?Procedure(s) Performed: COLONOSCOPY WITH PROPOFOL ? ?Patient location during evaluation: Endoscopy ?Anesthesia Type: General ?Level of consciousness: awake and alert ?Pain management: pain level controlled ?Vital Signs Assessment: post-procedure vital signs reviewed and stable ?Respiratory status: spontaneous breathing, nonlabored ventilation, respiratory function stable and patient connected to nasal cannula oxygen ?Cardiovascular status: blood pressure returned to baseline and stable ?Postop Assessment: no apparent nausea or vomiting ?Anesthetic complications: no ? ? ?No notable events documented. ? ? ?Last Vitals:  ?Vitals:  ? 04/03/22 0654 04/03/22 0812  ?BP: (!) 144/85 133/72  ?Pulse: 90 81  ?Resp: 20 16  ?Temp: (!) 35.6 ?C (!) 36.2 ?C  ?SpO2: 98% 97%  ?  ?Last Pain:  ?Vitals:  ? 04/03/22 0832  ?TempSrc:   ?PainSc: 0-No pain  ? ? ?  ?  ?  ?  ?  ?  ? ?Martha Clan ? ? ? ? ?

## 2022-04-03 NOTE — H&P (Signed)
Outpatient short stay form Pre-procedure ?04/03/2022  ?Lesly Rubenstein, MD ? ?Primary Physician: Rusty Aus, MD ? ?Reason for visit:  Screening ? ?History of present illness:   ? ?51 y/o lady with morbid obesity and hypertension here for screening colonoscopy. No blood thinners, no family history of GI malignancies. History of hysterectomy. ? ? ? ?Current Facility-Administered Medications:  ?  0.9 %  sodium chloride infusion, , Intravenous, Continuous, Alisha Bacus, Hilton Cork, MD, Last Rate: 20 mL/hr at 04/03/22 0706, 20 mL/hr at 04/03/22 0706 ? ?Medications Prior to Admission  ?Medication Sig Dispense Refill Last Dose  ? hydrochlorothiazide (HYDRODIURIL) 12.5 MG tablet Take 12.5 mg by mouth daily.   04/03/2022 at 0555  ? olmesartan (BENICAR) 20 MG tablet Take 20 mg by mouth daily.     ? ? ? ?Allergies  ?Allergen Reactions  ? Corticosteroids Other (See Comments)  ?  Weight gain. Patient would like to avoid.  ? ? ? ?Past Medical History:  ?Diagnosis Date  ? Hypertension   ? OSA (obstructive sleep apnea)   ? ? ?Review of systems:  Otherwise negative.  ? ? ?Physical Exam ? ?Gen: Alert, oriented. Appears stated age.  ?HEENT: [place]ERR[place]. ?Lungs: No respiratory distress ?CV: RRR ?Abd: soft, benign, no masses ?Ext: No edema ? ? ? ?Planned procedures: Proceed with colonoscopy. The patient understands the nature of the planned procedure, indications, risks, alternatives and potential complications including but not limited to bleeding, infection, perforation, damage to internal organs and possible oversedation/side effects from anesthesia. The patient agrees and gives consent to proceed.  ?Please refer to procedure notes for findings, recommendations and patient disposition/instructions.  ? ? ? ?Lesly Rubenstein, MD ?[facility] ? ? ? ?  ? ?

## 2022-04-03 NOTE — Anesthesia Preprocedure Evaluation (Signed)
Anesthesia Evaluation  ?Patient identified by MRN, date of birth, ID band ?Patient awake ? ? ? ?Reviewed: ?Allergy & Precautions, H&P , NPO status , Patient's Chart, lab work & pertinent test results, reviewed documented beta blocker date and time  ? ?History of Anesthesia Complications ?Negative for: history of anesthetic complications ? ?Airway ?Mallampati: I ? ?TM Distance: >3 FB ?Neck ROM: full ? ? ? Dental ? ?(+) Dental Advidsory Given, Teeth Intact ?  ?Pulmonary ?shortness of breath and with exertion, sleep apnea and Continuous Positive Airway Pressure Ventilation , neg COPD, neg recent URI,  ?  ?Pulmonary exam normal ?breath sounds clear to auscultation ? ? ? ? ? ? Cardiovascular ?Exercise Tolerance: Good ?hypertension, (-) angina(-) Past MI and (-) Cardiac Stents Normal cardiovascular exam(-) dysrhythmias (-) Valvular Problems/Murmurs ?Rhythm:regular Rate:Normal ? ? ?  ?Neuro/Psych ?negative neurological ROS ? negative psych ROS  ? GI/Hepatic ?negative GI ROS, Neg liver ROS,   ?Endo/Other  ?diabetes (borderline)Morbid obesity ? Renal/GU ?negative Renal ROS  ?negative genitourinary ?  ?Musculoskeletal ? ? Abdominal ?  ?Peds ? Hematology ?negative hematology ROS ?(+)   ?Anesthesia Other Findings ?Past Medical History: ?No date: Hypertension ?No date: OSA (obstructive sleep apnea) ? ? Reproductive/Obstetrics ?negative OB ROS ? ?  ? ? ? ? ? ? ? ? ? ? ? ? ? ?  ?  ? ? ? ? ? ? ? ? ?Anesthesia Physical ?Anesthesia Plan ? ?ASA: 3 ? ?Anesthesia Plan: General  ? ?Post-op Pain Management:   ? ?Induction: Intravenous ? ?PONV Risk Score and Plan: 3 and Propofol infusion and TIVA ? ?Airway Management Planned: Natural Airway and Nasal Cannula ? ?Additional Equipment:  ? ?Intra-op Plan:  ? ?Post-operative Plan:  ? ?Informed Consent: I have reviewed the patients History and Physical, chart, labs and discussed the procedure including the risks, benefits and alternatives for the proposed  anesthesia with the patient or authorized representative who has indicated his/her understanding and acceptance.  ? ? ? ?Dental Advisory Given ? ?Plan Discussed with: Anesthesiologist, CRNA and Surgeon ? ?Anesthesia Plan Comments:   ? ? ? ? ? ? ?Anesthesia Quick Evaluation ? ?

## 2022-04-03 NOTE — Op Note (Signed)
[facility] ?Gastroenterology ?Patient Name: Angel Sandoval ?Procedure Date: 04/03/2022 7:15 AM ?MRN: [id number] ?Account #: [id number] ?Date of Birth: August 14, 1971 ?Admit Type: Outpatient ?Age: 51 ?Room: [facility] ENDO ROOM 3 ?Gender: Female ?Note Status: Finalized ?Instrument Name: Colonoscope [id number] ?Procedure:             Colonoscopy ?Indications:           Screening for colorectal malignant neoplasm ?Providers:             Andrey Farmer MD, MD ?Referring MD:          Rusty Aus, MD (Referring MD) ?Medicines:             Monitored Anesthesia Care ?Complications:         No immediate complications. Estimated blood loss:  ?                       Minimal. ?Procedure:             Pre-Anesthesia Assessment: ?                       - Prior to the procedure, a History and Physical was  ?                       performed, and patient medications and allergies were  ?                       reviewed. The patient is competent. The risks and  ?                       benefits of the procedure and the sedation options and  ?                       risks were discussed with the patient. All questions  ?                       were answered and informed consent was obtained.  ?                       Patient identification and proposed procedure were  ?                       verified by the physician, the nurse, the  ?                       anesthesiologist, the anesthetist and the technician  ?                       in the endoscopy suite. Mental Status Examination:  ?                       alert and oriented. Airway Examination: normal  ?                       oropharyngeal airway and neck mobility. Respiratory  ?                       Examination: clear to auscultation. CV Examination:  ?  normal. Prophylactic Antibiotics: The patient does not  ?                       require prophylactic antibiotics. Prior  ?                       Anticoagulants: The patient has taken no previous  ?                        anticoagulant or antiplatelet agents. ASA Grade  ?                       Assessment: III - A patient with severe systemic  ?                       disease. After reviewing the risks and benefits, the  ?                       patient was deemed in satisfactory condition to  ?                       undergo the procedure. The anesthesia plan was to use  ?                       monitored anesthesia care (MAC). Immediately prior to  ?                       administration of medications, the patient was  ?                       re-assessed for adequacy to receive sedatives. The  ?                       heart rate, respiratory rate, oxygen saturations,  ?                       blood pressure, adequacy of pulmonary ventilation, and  ?                       response to care were monitored throughout the  ?                       procedure. The physical status of the patient was  ?                       re-assessed after the procedure. ?                       After obtaining informed consent, the colonoscope was  ?                       passed under direct vision. Throughout the procedure,  ?                       the patient's blood pressure, pulse, and oxygen  ?                       saturations were monitored continuously. The  ?  Colonoscope was introduced through the anus with the  ?                       intention of advancing to the cecum. The scope was  ?                       advanced to the ascending colon before the procedure  ?                       was aborted. Medications were given. The colonoscopy  ?                       was aborted due to significant looping. The quality of  ?                       the bowel preparation was good. The patient tolerated  ?                       the procedure well. ?Findings: ?     The perianal and digital rectal examinations were normal. ?     Two sessile polyps were found in the descending colon. The polyps were 1  ?     to 2 mm in size. These  polyps were removed with a cold snare. Resection  ?     and retrieval were complete. Estimated blood loss was minimal. ?     Multiple small-mouthed diverticula were found in the sigmoid colon and  ?     descending colon. ?     Internal hemorrhoids were found during retroflexion. The hemorrhoids  ?     were Grade I (internal hemorrhoids that do not prolapse). ?     The exam was otherwise without abnormality on direct and retroflexion  ?     views. ?Impression:            - The procedure was aborted due to significant looping. ?                       - Two 1 to 2 mm polyps in the descending colon,  ?                       removed with a cold snare. Resected and retrieved. ?                       - Diverticulosis in the sigmoid colon and in the  ?                       descending colon. ?                       - Internal hemorrhoids. ?                       - The examination was otherwise normal on direct and  ?                       retroflexion views. ?Recommendation:        - Discharge patient to home. ?                       -  Resume previous diet. ?                       - Continue present medications. ?                       - Await pathology results. ?                       - Perform a virtual colonoscopy at appointment to be  ?                       scheduled. ?Procedure Code(s):     --- Professional --- ?                       [phone], 52, Colonoscopy, flexible; with removal of  ?                       tumor(s), polyp(s), or other lesion(s) by snare  ?                       technique ?Diagnosis Code(s):     --- Professional --- ?                       Z12.11, Encounter for screening for malignant neoplasm  ?                       of colon ?                       K64.0, First degree hemorrhoids ?                       K63.5, Polyp of colon ?                       K57.30, Diverticulosis of large intestine without  ?                       perforation or abscess without bleeding ?CPT copyright 2019 American Medical  Association. All rights reserved. ?The codes documented in this report are preliminary and upon coder review may  ?be revised to meet current compliance requirements. ?Andrey Farmer MD, MD ?04/03/2022 8:18:22 AM ?Number of Addenda: 0 ?Note Initiated On: 04/03/2022 7:15 AM ?Total Procedure Duration: 0 hours 16 minutes 27 seconds  ?Estimated Blood Loss:  Estimated blood loss was minimal. ?     [facility] ?

## 2022-04-06 ENCOUNTER — Encounter: Payer: Self-pay | Admitting: Gastroenterology

## 2022-04-06 LAB — SURGICAL PATHOLOGY

## 2022-06-25 ENCOUNTER — Other Ambulatory Visit: Payer: Self-pay | Admitting: Gastroenterology

## 2022-06-25 DIAGNOSIS — Z1211 Encounter for screening for malignant neoplasm of colon: Secondary | ICD-10-CM

## 2022-06-25 DIAGNOSIS — Z8619 Personal history of other infectious and parasitic diseases: Secondary | ICD-10-CM

## 2022-08-27 ENCOUNTER — Encounter: Payer: Self-pay | Admitting: Emergency Medicine

## 2022-08-27 ENCOUNTER — Ambulatory Visit (INDEPENDENT_AMBULATORY_CARE_PROVIDER_SITE_OTHER): Payer: BC Managed Care – PPO

## 2022-08-27 ENCOUNTER — Ambulatory Visit
Admission: EM | Admit: 2022-08-27 | Discharge: 2022-08-27 | Disposition: A | Discharge status: Home / Self Care | Payer: BC Managed Care – PPO | Attending: Emergency Medicine | Admitting: Emergency Medicine

## 2022-08-27 DIAGNOSIS — M1712 Unilateral primary osteoarthritis, left knee: Secondary | ICD-10-CM | POA: Diagnosis not present

## 2022-08-27 DIAGNOSIS — M25562 Pain in left knee: Secondary | ICD-10-CM

## 2022-08-27 MED ORDER — MELOXICAM 15 MG PO TABS: 15.0000 mg | ORAL_TABLET | Freq: Every day | ORAL | 0 refills | Status: AC

## 2022-08-27 NOTE — ED Triage Notes (Signed)
Pt presents with left knee pain x 1 month. Pt denies any injury.

## 2022-08-27 NOTE — ED Provider Notes (Signed)
[facility]    CSN: [id number] Arrival date & time: 08/27/22  1194      History   Chief Complaint Chief Complaint  Patient presents with   Knee Pain    HPI Angel Sandoval is a 51 y.o. female.   HPI  51 year old female here for evaluation of left knee pain.  Patient reports that she has been experiencing pain in her left knee for the past month.  This is not associated with any known injury or falls.  She states that the pain is greatest when she stands and puts weight on it and if she has to walk distances.  She has noticed some swelling to the front of her knee but denies any redness or bruising.  Patient reports that a year ago she slipped on the ice and fell on her right knee which had a similar presentation.  It was not evaluated at that time and it took approximately a month for the numbness and swelling to go down.  Past Medical History:  Diagnosis Date   Hypertension    OSA (obstructive sleep apnea)     Patient Active Problem List   Diagnosis Date Noted   Low serum vitamin D 10/28/2016   OSA on CPAP 10/28/2015    Past Surgical History:  Procedure Laterality Date   ABDOMINAL HYSTERECTOMY     COLONOSCOPY WITH PROPOFOL N/A 04/03/2022   Procedure: COLONOSCOPY WITH PROPOFOL;  Surgeon: Lesly Rubenstein, MD;  Location: [facility] ENDOSCOPY;  Service: Endoscopy;  Laterality: N/A;    OB History   No obstetric history on file.      Home Medications    Prior to Admission medications   Medication Sig Start Date End Date Taking? Authorizing Provider  meloxicam (MOBIC) 15 MG tablet Take 1 tablet (15 mg total) by mouth daily. 08/27/22 09/26/22 Yes Margarette Canada, NP  hydrochlorothiazide (HYDRODIURIL) 12.5 MG tablet Take 12.5 mg by mouth daily.    [provider]  olmesartan (BENICAR) 20 MG tablet Take 20 mg by mouth daily. 11/08/18 11/08/19  [provider]    Family History Family History  Problem Relation Age of Onset   Breast cancer  Mother 45   Hypertension Father     Social History Social History   Tobacco Use   Smoking status: Never   Smokeless tobacco: Never  Vaping Use   Vaping Use: Never used  Substance Use Topics   Alcohol use: Yes   Drug use: Never     Allergies   Corticosteroids   Review of Systems Review of Systems  Musculoskeletal:  Positive for arthralgias and joint swelling.  Skin:  Negative for color change.  Neurological:  Negative for numbness.  Hematological: Negative.   Psychiatric/Behavioral: Negative.       Physical Exam Triage Vital Signs ED Triage Vitals  Enc Vitals Group     BP 08/27/22 0849 (!) 155/92     Pulse Rate 08/27/22 0849 79     Resp 08/27/22 0849 16     Temp 08/27/22 0849 98.4 F (36.9 C)     Temp Source 08/27/22 0849 Oral     SpO2 08/27/22 0849 98 %     Weight --      Height --      Head Circumference --      Peak Flow --      Pain Score 08/27/22 0847 7     Pain Loc --      Pain Edu? --  Excl. in GC? --    No data found.  Updated Vital Signs BP (!) 155/92 (BP Location: Left Wrist)   Pulse 79   Temp 98.4 F (36.9 C) (Oral)   Resp 16   SpO2 98%   Visual Acuity Right Eye Distance:   Left Eye Distance:   Bilateral Distance:    Right Eye Near:   Left Eye Near:    Bilateral Near:     Physical Exam Vitals and nursing note reviewed.  Constitutional:      Appearance: Normal appearance. She is not ill-appearing.  HENT:     Head: Normocephalic and atraumatic.  Musculoskeletal:        General: Swelling and tenderness present. No deformity or signs of injury. Normal range of motion.  Skin:    General: Skin is warm and dry.     Capillary Refill: Capillary refill takes less than 2 seconds.     Findings: No bruising or erythema.  Neurological:     General: No focal deficit present.     Mental Status: She is alert and oriented to person, place, and time.  Psychiatric:        Mood and Affect: Mood normal.        Behavior: Behavior normal.         Thought Content: Thought content normal.        Judgment: Judgment normal.      UC Treatments / Results  Labs (all labs ordered are listed, but only abnormal results are displayed) Labs Reviewed - No data to display  EKG   Radiology DG Knee Complete 4 Views Left  Result Date: 08/27/2022 CLINICAL DATA:  Anteromedial knee pain for 1 month EXAM: LEFT KNEE - COMPLETE 4+ VIEW COMPARISON:  None Available. FINDINGS: No acute fracture or dislocation. No aggressive osseous lesion. Normal alignment. Moderate osteoarthritis of the medial femorotibial compartment. Small lateral femorotibial compartment marginal osteophytes. Soft tissue are unremarkable. No radiopaque foreign body or soft tissue emphysema. IMPRESSION: 1.  No acute osseous injury of the left knee. 2. Moderate osteoarthritis of the medial femorotibial compartment. Electronically Signed   By: Kathreen Devoid M.D.   On: 08/27/2022 09:41    Procedures Procedures (including critical care time)  Medications Ordered in UC Medications - No data to display  Initial Impression / Assessment and Plan / UC Course  I have reviewed the triage vital signs and the nursing notes.  Pertinent labs & imaging results that were available during my care of the patient were reviewed by me and considered in my medical decision making (see chart for details).   Patient is a pleasant, nontoxic-appearing 51 year old female here for evaluation of an Verio anterior-medial left knee pain that has been present for the past month and is not associated with any trauma or injury.  Patient reports that she has something similar occurred to her right knee a year ago but that occurred after falling on ice.  This is not associate with any known trauma.  On exam patient does have swelling over the inferior anterior medial knee that is free of ecchymosis, erythema, heat, induration, or fluctuance.  She does not have pain with palpation of the patella, tibial tuberosity,  medial or lateral joint line, or popliteal fossa.  She states that when I bring her knee passively to full extension she feels pain over the area of swelling.  This pain is exacerbated with varus stress application but is negative with valgus stress application.  Anterior and posterior drawer  are negative.  DP and PT pulses are 2+.  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  No other changes to physical activity or routine.  I suspect that patient has some prepatellar bursitis given the swelling and tenderness.  I will obtain radiograph of left knee to rule out any osteoarthritis or bony abnormality.  Radiology impression of left knee films states no acute fracture or dislocation.  No aggressive osseous lesion.  Alignment is normal.  There is moderate osteoarthritis of the medial femoral-tibial compartment.  Small lateral femoral-tibial compartment marginal osteophytes.  After review of the x-ray and radiological findings I suspect that patient's swelling is secondary to some moderate osteoarthritis.  I will start her meloxicam 15 mg once daily for the pain as well as home physical therapy exercises and moist heat.  If her symptoms do not improve she should follow-up with orthopedics for possible designated physical therapy and other treatment modality options.  Final Clinical Impressions(s) / UC Diagnoses   Final diagnoses:  Primary osteoarthritis of left knee  Acute pain of left knee     Discharge Instructions      Start taking the meloxicam once daily for treatment of pain and inflammation in your left knee.  Do not take this with aspirin, Aleve, or ibuprofen.  You may supplement with over-the-counter Tylenol.  You may take a maximum of 1000 mg every 6 hours as needed for pain.  Apply moist heat to your knee between minutes at a time 2-3 times a  day to help improve blood flow and aid in resolution of swelling and pain relief.  Follow the home physical therapy exercises given your discharge instructions.  This will help improve mobility and decrease swelling.  If your symptoms do not improve, or they worsen, I recommend following up with orthopedics as you may need some designated physical therapy or other treatment modalities that we are unable to provide in the urgent care such as joint injections.  If the meloxicam works for you I recommend make an appointment with your PCP to manage this medication going forward.  I have given you a 30-day supply.     ED Prescriptions     Medication Sig Dispense Auth. Provider   meloxicam (MOBIC) 15 MG tablet Take 1 tablet (15 mg total) by mouth daily. 30 tablet Margarette Canada, NP      PDMP not reviewed this encounter.   Margarette Canada, NP 08/27/22 [phone]

## 2022-08-27 NOTE — Discharge Instructions (Addendum)
Start taking the meloxicam once daily for treatment of pain and inflammation in your left knee.  Do not take this with aspirin, Aleve, or ibuprofen.  You may supplement with over-the-counter Tylenol.  You may take a maximum of 1000 mg every 6 hours as needed for pain.  Apply moist heat to your knee between minutes at a time 2-3 times a day to help improve blood flow and aid in resolution of swelling and pain relief.  Follow the home physical therapy exercises given your discharge instructions.  This will help improve mobility and decrease swelling.  If your symptoms do not improve, or they worsen, I recommend following up with orthopedics as you may need some designated physical therapy or other treatment modalities that we are unable to provide in the urgent care such as joint injections.  If the meloxicam works for you I recommend make an appointment with your PCP to manage this medication going forward.  I have given you a 30-day supply.

## 2022-09-04 ENCOUNTER — Ambulatory Visit
Admission: RE | Admit: 2022-09-04 | Discharge: 2022-09-04 | Disposition: A | Discharge status: Home / Self Care | Payer: BC Managed Care – PPO | Source: Ambulatory Visit | Attending: Gastroenterology | Admitting: Gastroenterology

## 2022-09-04 DIAGNOSIS — Z1211 Encounter for screening for malignant neoplasm of colon: Secondary | ICD-10-CM

## 2022-09-04 DIAGNOSIS — Z8619 Personal history of other infectious and parasitic diseases: Secondary | ICD-10-CM

## 2024-07-17 ENCOUNTER — Other Ambulatory Visit: Payer: Self-pay | Admitting: Internal Medicine

## 2024-07-17 DIAGNOSIS — Z1231 Encounter for screening mammogram for malignant neoplasm of breast: Secondary | ICD-10-CM

## 2024-08-17 ENCOUNTER — Ambulatory Visit
Admission: RE | Admit: 2024-08-17 | Discharge: 2024-08-17 | Disposition: A | Discharge status: Home / Self Care | Source: Ambulatory Visit | Attending: Internal Medicine | Admitting: Internal Medicine

## 2024-08-17 DIAGNOSIS — Z1231 Encounter for screening mammogram for malignant neoplasm of breast: Secondary | ICD-10-CM | POA: Insufficient documentation

## 2024-08-24 ENCOUNTER — Other Ambulatory Visit: Payer: Self-pay | Admitting: Internal Medicine

## 2024-08-24 DIAGNOSIS — R928 Other abnormal and inconclusive findings on diagnostic imaging of breast: Secondary | ICD-10-CM

## 2024-08-28 ENCOUNTER — Ambulatory Visit
Admission: RE | Admit: 2024-08-28 | Discharge: 2024-08-28 | Disposition: A | Discharge status: Home / Self Care | Source: Ambulatory Visit | Attending: Internal Medicine | Admitting: Internal Medicine

## 2024-08-28 DIAGNOSIS — R928 Other abnormal and inconclusive findings on diagnostic imaging of breast: Secondary | ICD-10-CM

## 2024-09-28 ENCOUNTER — Encounter

## 2024-12-21 ENCOUNTER — Other Ambulatory Visit: Payer: Self-pay | Admitting: Internal Medicine

## 2024-12-21 DIAGNOSIS — N6489 Other specified disorders of breast: Secondary | ICD-10-CM

## 2025-02-26 ENCOUNTER — Other Ambulatory Visit

## 2025-02-26 ENCOUNTER — Encounter
